# Patient Record
Sex: Female | Born: 1960 | Race: White | Hispanic: No | Marital: Married | Smoking: Never smoker
Health system: Southern US, Community
[De-identification: ages and names within clinical notes are randomized; demographics above are authoritative.]

## PROBLEM LIST (undated history)

## (undated) HISTORY — PX: KNEE SURGERY: SHX244

---

## 2014-07-29 ENCOUNTER — Ambulatory Visit: Payer: Self-pay | Admitting: Family Medicine

## 2014-07-29 LAB — URINALYSIS, COMPLETE
Glucose,UR: NEGATIVE
LEUKOCYTE ESTERASE: NEGATIVE
Nitrite: NEGATIVE
Ph: 5.5 (ref 5.0–8.0)

## 2014-07-31 LAB — URINE CULTURE

## 2015-05-03 ENCOUNTER — Ambulatory Visit
Admission: EM | Admit: 2015-05-03 | Discharge: 2015-05-03 | Disposition: A | Discharge status: Home / Self Care | Payer: Managed Care, Other (non HMO) | Attending: Family Medicine | Admitting: Family Medicine

## 2015-05-03 ENCOUNTER — Encounter: Payer: Self-pay | Admitting: Emergency Medicine

## 2015-05-03 DIAGNOSIS — F1099 Alcohol use, unspecified with unspecified alcohol-induced disorder: Secondary | ICD-10-CM | POA: Insufficient documentation

## 2015-05-03 DIAGNOSIS — R350 Frequency of micturition: Secondary | ICD-10-CM | POA: Insufficient documentation

## 2015-05-03 DIAGNOSIS — N951 Menopausal and female climacteric states: Secondary | ICD-10-CM

## 2015-05-03 DIAGNOSIS — Z780 Asymptomatic menopausal state: Secondary | ICD-10-CM | POA: Diagnosis not present

## 2015-05-03 DIAGNOSIS — B9620 Unspecified Escherichia coli [E. coli] as the cause of diseases classified elsewhere: Secondary | ICD-10-CM | POA: Insufficient documentation

## 2015-05-03 DIAGNOSIS — N390 Urinary tract infection, site not specified: Secondary | ICD-10-CM | POA: Diagnosis not present

## 2015-05-03 DIAGNOSIS — R109 Unspecified abdominal pain: Secondary | ICD-10-CM | POA: Diagnosis present

## 2015-05-03 LAB — URINALYSIS COMPLETE WITH MICROSCOPIC (ARMC ONLY)
Bilirubin Urine: NEGATIVE
Glucose, UA: NEGATIVE mg/dL
Ketones, ur: NEGATIVE mg/dL
Nitrite: NEGATIVE
Protein, ur: 100 mg/dL — AB
pH: 5.5 (ref 5.0–8.0)

## 2015-05-03 MED ORDER — CIPROFLOXACIN HCL 500 MG PO TABS: 500.0000 mg | ORAL_TABLET | Freq: Two times a day (BID) | ORAL | Status: AC

## 2015-05-03 MED ORDER — PHENAZOPYRIDINE HCL 200 MG PO TABS: 200.0000 mg | ORAL_TABLET | Freq: Three times a day (TID) | ORAL | Status: AC | PRN

## 2015-05-03 NOTE — ED Provider Notes (Addendum)
CSN: [id number]     Arrival date & time 05/03/15  0825 History   First MD Initiated Contact with Patient 05/03/15 [phone]     Chief Complaint  Patient presents with  . Urinary Tract Infection  . Urinary Frequency   (Consider location/radiation/quality/duration/timing/severity/associated sxs/prior Treatment) HPI Comments: Married caucasian female here for urinary frequency x 1 week; flank pain x 1 day; noticed increased frequency UTI since starting perimenopause this past year; worst symptoms yesterday pain with walking and low back pain also;  Father with kidney stones unsure of type has required surgery she has tried cutting out caffeine and changing soaps since these started without any improvement; doesn't like to drink water and follows vegan diet cheese only occasionally  Patient is a 54 y.o. female presenting with urinary tract infection and frequency. The history is provided by the patient.  Urinary Tract Infection Pain quality:  Burning Pain severity:  Moderate Onset quality:  Sudden Duration:  1 week Timing:  Constant Progression:  Worsening Chronicity:  Recurrent Recent urinary tract infections: yes   Relieved by:  Nothing Worsened by:  Caffeine Urinary symptoms: frequent urination   Urinary symptoms: no discolored urine, no foul-smelling urine, no hematuria, no hesitancy and no bladder incontinence   Associated symptoms: flank pain   Associated symptoms: no abdominal pain, no fever, no genital lesions, no nausea, no vaginal discharge and no vomiting   Risk factors: recurrent urinary tract infections and sexually active   Risk factors: no hx of pyelonephritis, no hx of urolithiasis, no kidney transplant, not pregnant, no renal cysts, no renal disease, not single kidney, no sexually transmitted infections and no urinary catheter   Urinary Frequency Pertinent negatives include no chest pain, no abdominal pain, no headaches and no shortness of breath.    History reviewed. No  pertinent past medical history. Past Surgical History  Procedure Laterality Date  . Knee surgery     History reviewed. No pertinent family history. Social History  Substance Use Topics  . Smoking status: Never Smoker   . Smokeless tobacco: None  . Alcohol Use: Yes     Comment: once weekly   OB History    No data available     Review of Systems  Constitutional: Negative for fever, chills, diaphoresis, activity change, appetite change and fatigue.  HENT: Negative for congestion, dental problem, drooling, ear discharge, ear pain, facial swelling, hearing loss, mouth sores, nosebleeds, postnasal drip, rhinorrhea, sinus pressure, sneezing, sore throat, tinnitus, trouble swallowing and voice change.   Eyes: Negative for photophobia, pain, discharge, redness, itching and visual disturbance.  Respiratory: Negative for cough, choking, chest tightness, shortness of breath, wheezing and stridor.   Cardiovascular: Negative for chest pain, palpitations and leg swelling.  Gastrointestinal: Negative for nausea, vomiting, abdominal pain, diarrhea, constipation, blood in stool and abdominal distention.  Endocrine: Negative for cold intolerance and heat intolerance.  Genitourinary: Positive for frequency and flank pain. Negative for vaginal discharge.  Musculoskeletal: Positive for back pain. Negative for joint swelling, arthralgias, gait problem, neck pain and neck stiffness.  Skin: Negative for color change, pallor, rash and wound.  Allergic/Immunologic: Negative for environmental allergies and food allergies.  Neurological: Negative for dizziness, tremors, seizures, syncope, facial asymmetry, speech difficulty, weakness, light-headedness, numbness and headaches.  Hematological: Negative for adenopathy. Does not bruise/bleed easily.  Psychiatric/Behavioral: Negative for behavioral problems, confusion, sleep disturbance and agitation.    Allergies  Review of patient's allergies indicates no known  allergies.  Home Medications   Prior to Admission medications  Medication Sig Start Date End Date Taking? Authorizing Provider  ciprofloxacin (CIPRO) 500 MG tablet Take 1 tablet (500 mg total) by mouth 2 (two) times daily. 05/03/15   Barbaraann Barthel, NP  phenazopyridine (PYRIDIUM) 200 MG tablet Take 1 tablet (200 mg total) by mouth 3 (three) times daily as needed for pain. 05/03/15   Jarold Song Betancourt, NP   BP 130/65 mmHg  Pulse 59  Temp(Src) 98 F (36.7 C) (Oral)  Resp 16  SpO2 98% Physical Exam  Constitutional: She is oriented to person, place, and time. Vital signs are normal. She appears well-developed and well-nourished. No distress.  HENT:  Head: Normocephalic and atraumatic.  Right Ear: External ear normal.  Left Ear: External ear normal.  Nose: Nose normal.  Mouth/Throat: Oropharynx is clear and moist. No oropharyngeal exudate.  Eyes: Conjunctivae, EOM and lids are normal. Pupils are equal, round, and reactive to light. Right eye exhibits no discharge. Left eye exhibits no discharge.  Neck: Trachea normal and normal range of motion. Neck supple. No tracheal deviation present.  Cardiovascular: Normal rate, regular rhythm, S1 normal, S2 normal, normal heart sounds and intact distal pulses.  PMI is not displaced.  Exam reveals no gallop and no friction rub.   No murmur heard. Pulmonary/Chest: Effort normal and breath sounds normal. No accessory muscle usage or stridor. No respiratory distress. She has no decreased breath sounds. She has no wheezes. She has no rhonchi. She has no rales. She exhibits no tenderness.  Abdominal: Soft. Bowel sounds are normal. She exhibits no shifting dullness, no distension, no pulsatile liver, no fluid wave, no abdominal bruit, no ascites, no pulsatile midline mass and no mass. There is no hepatosplenomegaly. There is no tenderness. There is no rigidity, no rebound, no guarding, no CVA tenderness, no tenderness at McBurney's point and negative Murphy's  sign. Hernia confirmed negative in the ventral area.  Dull to percussion x 4 quads  Musculoskeletal: Normal range of motion. She exhibits no edema or tenderness.  Lymphadenopathy:    She has no cervical adenopathy.  Neurological: She is alert and oriented to person, place, and time. She exhibits normal muscle tone. Coordination normal.  Skin: Skin is warm, dry and intact. No rash noted. She is not diaphoretic. No erythema. No pallor.  Psychiatric: She has a normal mood and affect. Her speech is normal and behavior is normal. Judgment and thought content normal. Cognition and memory are normal.  Nursing note and vitals reviewed.   ED Course  Procedures (including critical care time) Labs Review Labs Reviewed  URINALYSIS COMPLETEWITH MICROSCOPIC ([facility] ONLY) - Abnormal; Notable for the following:    APPearance CLOUDY (*)    Specific Gravity, Urine >1.030 (*)    Hgb urine dipstick 3+ (*)    Protein, ur 100 (*)    Leukocytes, UA 3+ (*)    Bacteria, UA FEW (*)    Squamous Epithelial / LPF 6-30 (*)    All other components within normal limits  URINE CULTURE   Reviewed previous results Oct 2015 + protein, bacteria negative culture Imaging Review No results found.  7829 patient to bathroom to urinate ambulatory without difficulty  1110 discussed urinalysis results with patient and given copy; very dilute urine infection worse than previous.  Discussed black box warning on cipro; patient prefers to take it as cleared up her last infection.  Urine did not grow out bacteria last time so unable to tailor if any resistance at this time.  Hydrate, hydrate, hydrate.  Follow  up with father to find out what type of stones he has.  Follow up with PCM if no improvement in symptoms/reoccurence consider urology referral/imaging. Discussed drinking at least 2 liters fluid per day during summer as active outside.   Patient verbalized understanding of information/instructions, agreed with plan of care and  had no further questions at this time. MDM   1. Perimenopause   2. UTI (lower urinary tract infection)    Plan: 1. Test/x-ray results and diagnosis reviewed with patient 2. rx as per orders; risks, benefits, potential side effects reviewed with patient 3. Recommend supportive treatment with hydration, pyridium 4. F/u prn if symptoms worsen or don't improve  Medications as directed.  Patient is also to push fluids and may use Pyridium 200mg  po TID as needed Rx given. Call or return to clinic as needed if these symptoms worsen or fail to improve as anticipated e.g. Unable to void every 8 hours, hematuria gross, changes in color of urine after pyridium use completed e.g. Tea colored/cloudy/foul odor, headache, worsening back pain.  Exitcare handout on cystitis, perimenopause, kidney stones, urethritis given to patient  Discussed showering after sex, lubricant/trauma may cause urethritis/cystitis, especially with decreased estrogen at risk UTI/urethritis.  Discussed hydration status, previous urinalysis results probable negative urine culture as unable to void and po hydration x 3 cups water before able to urinate in clinic.  Will call with urine culture results once available typically 48 hours. exitcare handouts on perimenopause, urethritis, UTI, kidney stones, dehydration given to patient.  Patient verbalized agreement and understanding of treatment plan and had no further questions at this time. P2:  Hydrate and cranberry juice   Barbaraann Barthel, NP 05/03/15 1115  06 May 2015 at 0800 left message for patient to contact clinic.  E.coli on urine culture sensitive to cipro.  If patient still symptomatic will call in another antibiotic for her.  Barbaraann Barthel, NP 05/06/15 0800

## 2015-05-03 NOTE — Discharge Instructions (Signed)
Perimenopause Perimenopause is the time when your body begins to move into the menopause (no menstrual period for 12 straight months). It is a natural process. Perimenopause can begin 2-8 years before the menopause and usually lasts for 1 year after the menopause. During this time, your ovaries may or may not produce an egg. The ovaries vary in their production of estrogen and progesterone hormones each month. This can cause irregular menstrual periods, difficulty getting pregnant, vaginal bleeding between periods, and uncomfortable symptoms. CAUSES  Irregular production of the ovarian hormones, estrogen and progesterone, and not ovulating every month.  Other causes include:  Tumor of the pituitary gland in the brain.  Medical disease that affects the ovaries.  Radiation treatment.  Chemotherapy.  Unknown causes.  Heavy smoking and excessive alcohol intake can bring on perimenopause sooner. SIGNS AND SYMPTOMS   Hot flashes.  Night sweats.  Irregular menstrual periods.  Decreased sex drive.  Vaginal dryness.  Headaches.  Mood swings.  Depression.  Memory problems.  Irritability.  Tiredness.  Weight gain.  Trouble getting pregnant.  The beginning of losing bone cells (osteoporosis).  The beginning of hardening of the arteries (atherosclerosis). DIAGNOSIS  Your health care provider will make a diagnosis by analyzing your age, menstrual history, and symptoms. He or she will do a physical exam and note any changes in your body, especially your female organs. Female hormone tests may or may not be helpful depending on the amount of female hormones you produce and when you produce them. However, other hormone tests may be helpful to rule out other problems. TREATMENT  In some cases, no treatment is needed. The decision on whether treatment is necessary during the perimenopause should be made by you and your health care provider based on how the symptoms are affecting you  and your lifestyle. Various treatments are available, such as:  Treating individual symptoms with a specific medicine for that symptom.  Herbal medicines that can help specific symptoms.  Counseling.  Group therapy. HOME CARE INSTRUCTIONS   Keep track of your menstrual periods (when they occur, how heavy they are, how long between periods, and how long they last) as well as your symptoms and when they started.  Only take over-the-counter or prescription medicines as directed by your health care provider.  Sleep and rest.  Exercise.  Eat a diet that contains calcium (good for your bones) and soy (acts like the estrogen hormone).  Do not smoke.  Avoid alcoholic beverages.  Take vitamin supplements as recommended by your health care provider. Taking vitamin E may help in certain cases.  Take calcium and vitamin D supplements to help prevent bone loss.  Group therapy is sometimes helpful.  Acupuncture may help in some cases. SEEK MEDICAL CARE IF:   You have questions about any symptoms you are having.  You need a referral to a specialist (gynecologist, psychiatrist, or psychologist). SEEK IMMEDIATE MEDICAL CARE IF:   You have vaginal bleeding.  Your period lasts longer than 8 days.  Your periods are recurring sooner than 21 days.  You have bleeding after intercourse.  You have severe depression.  You have pain when you urinate.  You have severe headaches.  You have vision problems. Document Released: 10/08/2004 Document Revised: 06/21/2013 Document Reviewed: 03/30/2013 [facility] Patient Information 2015 [place], [place]. This information is not intended to replace advice given to you by your health care provider. Make sure you discuss any questions you have with your health care provider. Urethritis Urethritis is an inflammation  of the tube through which urine exits your bladder (urethra).  CAUSES Urethritis is often caused by an infection in your urethra. The  infection can be viral, like herpes. The infection can also be bacterial, like gonorrhea. RISK FACTORS Risk factors of urethritis include:  Having sex without using a condom.  Having multiple sexual partners.  Having poor hygiene. SIGNS AND SYMPTOMS Symptoms of urethritis are less noticeable in women than in men. These symptoms include:  Burning feeling when you urinate (dysuria).  Discharge from your urethra.  Blood in your urine (hematuria).  Urinating more than usual. DIAGNOSIS  To confirm a diagnosis of urethritis, your health care provider will do the following:  Ask about your sexual history.  Perform a physical exam.  Have you provide a sample of your urine for lab testing.  Use a cotton swab to gently collect a sample from your urethra for lab testing. TREATMENT  It is important to treat urethritis. Depending on the cause, untreated urethritis may lead to serious genital infections and possibly infertility. Urethritis caused by a bacterial infection is treated with antibiotic medicine. All sexual partners must be treated.  HOME CARE INSTRUCTIONS  Do not have sex until the test results are known and treatment is completed, even if your symptoms go away before you finish treatment.  If you were prescribed an antibiotic, finish it all even if you start to feel better. SEEK MEDICAL CARE IF:   Your symptoms are not improved in 3 days.  Your symptoms are getting worse.  You develop abdominal pain or pelvic pain (in women).  You develop joint pain.  You have a fever. SEEK IMMEDIATE MEDICAL CARE IF:   You have severe pain in the belly, back, or side.  You have repeated vomiting. MAKE SURE YOU:  Understand these instructions.  Will watch your condition.  Will get help right away if you are not doing well or get worse. Document Released: 02/24/2001 Document Revised: 01/15/2014 Document Reviewed: 05/01/2013 [facility] Patient Information 2015 [place], [place].  This information is not intended to replace advice given to you by your health care provider. Make sure you discuss any questions you have with your health care provider. Kidney Stones Kidney stones (urolithiasis) are deposits that form inside your kidneys. The intense pain is caused by the stone moving through the urinary tract. When the stone moves, the ureter goes into spasm around the stone. The stone is usually passed in the urine.  CAUSES   A disorder that makes certain neck glands produce too much parathyroid hormone (primary hyperparathyroidism).  A buildup of uric acid crystals, similar to gout in your joints.  Narrowing (stricture) of the ureter.  A kidney obstruction present at birth (congenital obstruction).  Previous surgery on the kidney or ureters.  Numerous kidney infections. SYMPTOMS   Feeling sick to your stomach (nauseous).  Throwing up (vomiting).  Blood in the urine (hematuria).  Pain that usually spreads (radiates) to the groin.  Frequency or urgency of urination. DIAGNOSIS   Taking a history and physical exam.  Blood or urine tests.  CT scan.  Occasionally, an examination of the inside of the urinary bladder (cystoscopy) is performed. TREATMENT   Observation.  Increasing your fluid intake.  Extracorporeal shock wave lithotripsy--This is a noninvasive procedure that uses shock waves to break up kidney stones.  Surgery may be needed if you have severe pain or persistent obstruction. There are various surgical procedures. Most of the procedures are performed with the use of small  instruments. Only small incisions are needed to accommodate these instruments, so recovery time is minimized. The size, location, and chemical composition are all important variables that will determine the proper choice of action for you. Talk to your health care provider to better understand your situation so that you will minimize the risk of injury to yourself and your  kidney.  HOME CARE INSTRUCTIONS   Drink enough water and fluids to keep your urine clear or pale yellow. This will help you to pass the stone or stone fragments.  Strain all urine through the provided strainer. Keep all particulate matter and stones for your health care provider to see. The stone causing the pain may be as small as a grain of salt. It is very important to use the strainer each and every time you pass your urine. The collection of your stone will allow your health care provider to analyze it and verify that a stone has actually passed. The stone analysis will often identify what you can do to reduce the incidence of recurrences.  Only take over-the-counter or prescription medicines for pain, discomfort, or fever as directed by your health care provider.  Make a follow-up appointment with your health care provider as directed.  Get follow-up X-rays if required. The absence of pain does not always mean that the stone has passed. It may have only stopped moving. If the urine remains completely obstructed, it can cause loss of kidney function or even complete destruction of the kidney. It is your responsibility to make sure X-rays and follow-ups are completed. Ultrasounds of the kidney can show blockages and the status of the kidney. Ultrasounds are not associated with any radiation and can be performed easily in a matter of minutes. SEEK MEDICAL CARE IF:  You experience pain that is progressive and unresponsive to any pain medicine you have been prescribed. SEEK IMMEDIATE MEDICAL CARE IF:   Pain cannot be controlled with the prescribed medicine.  You have a fever or shaking chills.  The severity or intensity of pain increases over 18 hours and is not relieved by pain medicine.  You develop a new onset of abdominal pain.  You feel faint or pass out.  You are unable to urinate. MAKE SURE YOU:   Understand these instructions.  Will watch your condition.  Will get help  right away if you are not doing well or get worse. Document Released: 08/31/2005 Document Revised: 05/03/2013 Document Reviewed: 02/01/2013 [facility] Patient Information 2015 [place], [place]. This information is not intended to replace advice given to you by your health care provider. Make sure you discuss any questions you have with your health care provider. Dietary Guidelines to Help Prevent Kidney Stones Your risk of kidney stones can be decreased by adjusting the foods you eat. The most important thing you can do is drink enough fluid. You should drink enough fluid to keep your urine clear or pale yellow. The following guidelines provide specific information for the type of kidney stone you have had. GUIDELINES ACCORDING TO TYPE OF KIDNEY STONE Calcium Oxalate Kidney Stones  Reduce the amount of salt you eat. Foods that have a lot of salt cause your body to release excess calcium into your urine. The excess calcium can combine with a substance called oxalate to form kidney stones.  Reduce the amount of animal protein you eat if the amount you eat is excessive. Animal protein causes your body to release excess calcium into your urine. Ask your dietitian how much protein from  animal sources you should be eating.  Avoid foods that are high in oxalates. If you take vitamins, they should have less than 500 mg of vitamin C. Your body turns vitamin C into oxalates. You do not need to avoid fruits and vegetables high in vitamin C. Calcium Phosphate Kidney Stones  Reduce the amount of salt you eat to help prevent the release of excess calcium into your urine.  Reduce the amount of animal protein you eat if the amount you eat is excessive. Animal protein causes your body to release excess calcium into your urine. Ask your dietitian how much protein from animal sources you should be eating.  Get enough calcium from food or take a calcium supplement (ask your dietitian for recommendations). Food sources  of calcium that do not increase your risk of kidney stones include:  Broccoli.  Dairy products, such as cheese and yogurt.  Pudding. Uric Acid Kidney Stones  Do not have more than 6 oz of animal protein per day. FOOD SOURCES Animal Protein Sources  Meat (all types).  Poultry.  Eggs.  Fish, seafood. Foods High in [employer] seasonings.  Soy sauce.  Teriyaki sauce.  Cured and processed meats.  Salted crackers and snack foods.  Fast food.  Canned soups and most canned foods. Foods High in Oxalates  Grains:  Amaranth.  Barley.  Grits.  Wheat germ.  Bran.  Buckwheat flour.  All bran cereals.  Pretzels.  Whole wheat bread.  Vegetables:  Beans (wax).  Beets and beet greens.  Collard greens.  Eggplant.  [place].  Leeks.  Okra.  Parsley.  Rutabagas.  Spinach.  Swiss chard.  Tomato paste.  Fried potatoes.  Sweet potatoes.  Fruits:  Red currants.  Figs.  Kiwi.  Rhubarb.  Meat and Other Protein Sources:  Beans (dried).  Soy burgers and other soybean products.  Miso.  Nuts (peanuts, almonds, pecans, cashews, hazelnuts).  Nut butters.  Sesame seeds and tahini (paste made of sesame seeds).  Poppy seeds.  Beverages:  Chocolate drink mixes.  Soy milk.  Instant iced tea.  Juices made from high-oxalate fruits or vegetables.  Other:  Carob.  Chocolate.  Fruitcake.  Marmalades. Document Released: 12/26/2010 Document Revised: 09/05/2013 Document Reviewed: 07/28/2013 [facility] Patient Information 2015 [place], [place]. This information is not intended to replace advice given to you by your health care provider. Make sure you discuss any questions you have with your health care provider. Urinary Tract Infection Urinary tract infections (UTIs) can develop anywhere along your urinary tract. Your urinary tract is your body's drainage system for removing wastes and extra water. Your urinary tract includes two  kidneys, two ureters, a bladder, and a urethra. Your kidneys are a pair of bean-shaped organs. Each kidney is about the size of your fist. They are located below your ribs, one on each side of your spine. CAUSES Infections are caused by microbes, which are microscopic organisms, including fungi, viruses, and bacteria. These organisms are so small that they can only be seen through a microscope. Bacteria are the microbes that most commonly cause UTIs. SYMPTOMS  Symptoms of UTIs may vary by age and gender of the patient and by the location of the infection. Symptoms in young women typically include a frequent and intense urge to urinate and a painful, burning feeling in the bladder or urethra during urination. Older women and men are more likely to be tired, shaky, and weak and have muscle aches and abdominal pain. A fever may mean the infection  is in your kidneys. Other symptoms of a kidney infection include pain in your back or sides below the ribs, nausea, and vomiting. DIAGNOSIS To diagnose a UTI, your caregiver will ask you about your symptoms. Your caregiver also will ask to provide a urine sample. The urine sample will be tested for bacteria and white blood cells. White blood cells are made by your body to help fight infection. TREATMENT  Typically, UTIs can be treated with medication. Because most UTIs are caused by a bacterial infection, they usually can be treated with the use of antibiotics. The choice of antibiotic and length of treatment depend on your symptoms and the type of bacteria causing your infection. HOME CARE INSTRUCTIONS  If you were prescribed antibiotics, take them exactly as your caregiver instructs you. Finish the medication even if you feel better after you have only taken some of the medication.  Drink enough water and fluids to keep your urine clear or pale yellow.  Avoid caffeine, tea, and carbonated beverages. They tend to irritate your bladder.  Empty your bladder  often. Avoid holding urine for long periods of time.  Empty your bladder before and after sexual intercourse.  After a bowel movement, women should cleanse from front to back. Use each tissue only once. SEEK MEDICAL CARE IF:   You have back pain.  You develop a fever.  Your symptoms do not begin to resolve within 3 days. SEEK IMMEDIATE MEDICAL CARE IF:   You have severe back pain or lower abdominal pain.  You develop chills.  You have nausea or vomiting.  You have continued burning or discomfort with urination. MAKE SURE YOU:   Understand these instructions.  Will watch your condition.  Will get help right away if you are not doing well or get worse. Document Released: 06/10/2005 Document Revised: 03/01/2012 Document Reviewed: 10/09/2011 [facility] Patient Information 2015 [place], [place]. This information is not intended to replace advice given to you by your health care provider. Make sure you discuss any questions you have with your health care provider.

## 2015-05-03 NOTE — ED Notes (Signed)
Pt states tht she has had urinary frequency for the past 4 days with urinary pressure

## 2015-05-05 LAB — URINE CULTURE
Culture: >=100000
SPECIAL REQUESTS: NORMAL

## 2017-07-19 ENCOUNTER — Other Ambulatory Visit: Payer: Self-pay | Admitting: Family Medicine

## 2017-07-19 DIAGNOSIS — Z1231 Encounter for screening mammogram for malignant neoplasm of breast: Secondary | ICD-10-CM

## 2017-07-21 ENCOUNTER — Ambulatory Visit
Admission: RE | Admit: 2017-07-21 | Discharge: 2017-07-21 | Disposition: A | Discharge status: Home / Self Care | Payer: Managed Care, Other (non HMO) | Source: Ambulatory Visit | Attending: Family Medicine | Admitting: Family Medicine

## 2017-07-21 DIAGNOSIS — Z1231 Encounter for screening mammogram for malignant neoplasm of breast: Secondary | ICD-10-CM | POA: Insufficient documentation

## 2018-06-14 ENCOUNTER — Other Ambulatory Visit: Payer: Self-pay | Admitting: Family Medicine

## 2018-06-14 DIAGNOSIS — R102 Pelvic and perineal pain: Secondary | ICD-10-CM

## 2018-06-15 ENCOUNTER — Other Ambulatory Visit: Payer: Self-pay | Admitting: Family Medicine

## 2018-06-15 DIAGNOSIS — Z1231 Encounter for screening mammogram for malignant neoplasm of breast: Secondary | ICD-10-CM

## 2018-06-20 ENCOUNTER — Ambulatory Visit
Admission: RE | Admit: 2018-06-20 | Discharge: 2018-06-20 | Disposition: A | Discharge status: Home / Self Care | Payer: 59 | Source: Ambulatory Visit | Attending: Family Medicine | Admitting: Family Medicine

## 2018-06-20 DIAGNOSIS — R102 Pelvic and perineal pain: Secondary | ICD-10-CM | POA: Insufficient documentation

## 2018-07-25 ENCOUNTER — Encounter (INDEPENDENT_AMBULATORY_CARE_PROVIDER_SITE_OTHER): Payer: Self-pay

## 2018-07-25 ENCOUNTER — Ambulatory Visit
Admission: RE | Admit: 2018-07-25 | Discharge: 2018-07-25 | Disposition: A | Discharge status: Home / Self Care | Payer: 59 | Source: Ambulatory Visit | Attending: Family Medicine | Admitting: Family Medicine

## 2018-07-25 DIAGNOSIS — Z1231 Encounter for screening mammogram for malignant neoplasm of breast: Secondary | ICD-10-CM | POA: Diagnosis present

## 2019-07-24 ENCOUNTER — Other Ambulatory Visit: Payer: Self-pay | Admitting: Family Medicine

## 2019-07-24 DIAGNOSIS — Z1231 Encounter for screening mammogram for malignant neoplasm of breast: Secondary | ICD-10-CM

## 2019-08-02 ENCOUNTER — Other Ambulatory Visit: Payer: Self-pay

## 2019-08-02 ENCOUNTER — Ambulatory Visit
Admission: RE | Admit: 2019-08-02 | Discharge: 2019-08-02 | Disposition: A | Discharge status: Home / Self Care | Payer: 59 | Source: Ambulatory Visit | Attending: Family Medicine | Admitting: Family Medicine

## 2019-08-02 DIAGNOSIS — Z1231 Encounter for screening mammogram for malignant neoplasm of breast: Secondary | ICD-10-CM | POA: Diagnosis not present

## 2020-04-02 IMAGING — MG DIGITAL SCREENING BILAT W/ TOMO W/ CAD
6 of 12 series · 6 of 36 positions shown · non-contrast
Comparison: Previous exam(s).

CLINICAL DATA: Screening.

EXAM:
DIGITAL SCREENING BILATERAL MAMMOGRAM WITH TOMO AND CAD

[L MLO synth-2D (1 of 2)]
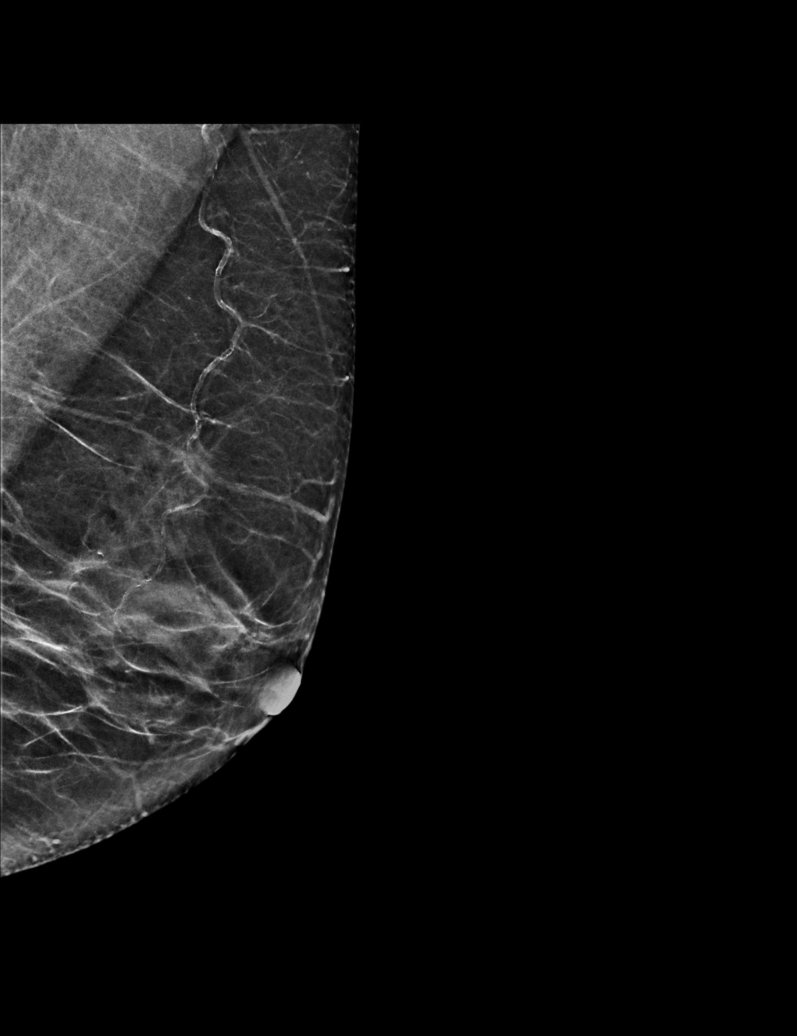

[R CC synth-2D (1 of 2)]
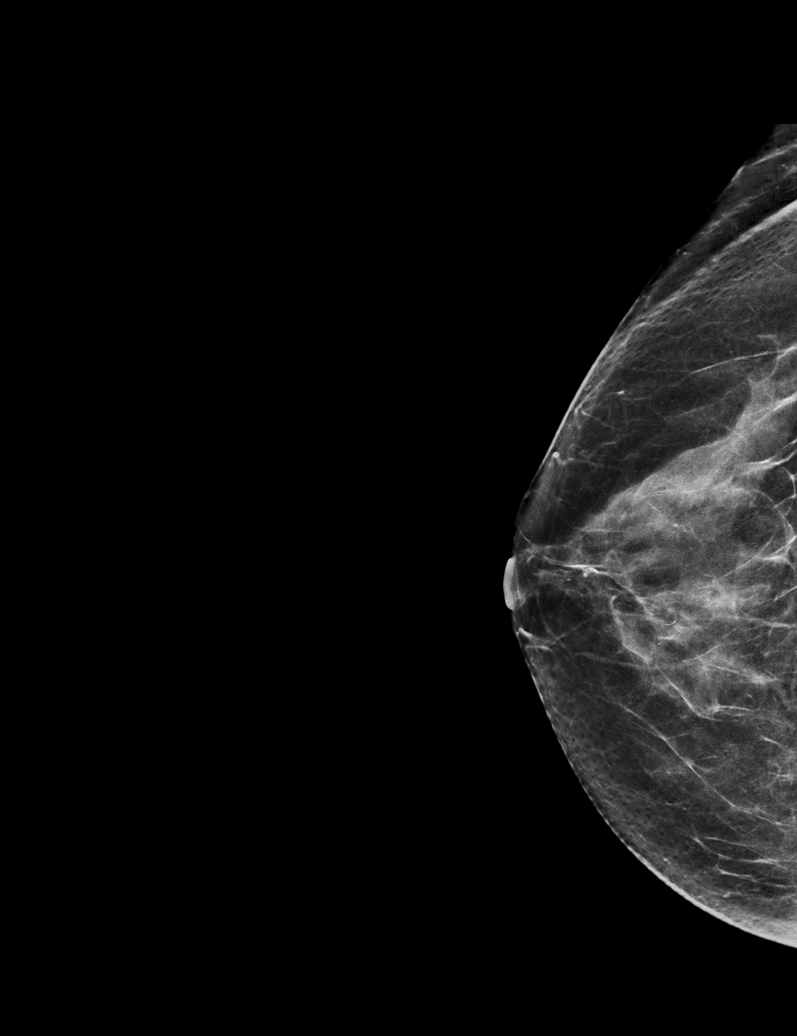

[R CC synth-2D (2 of 2)]
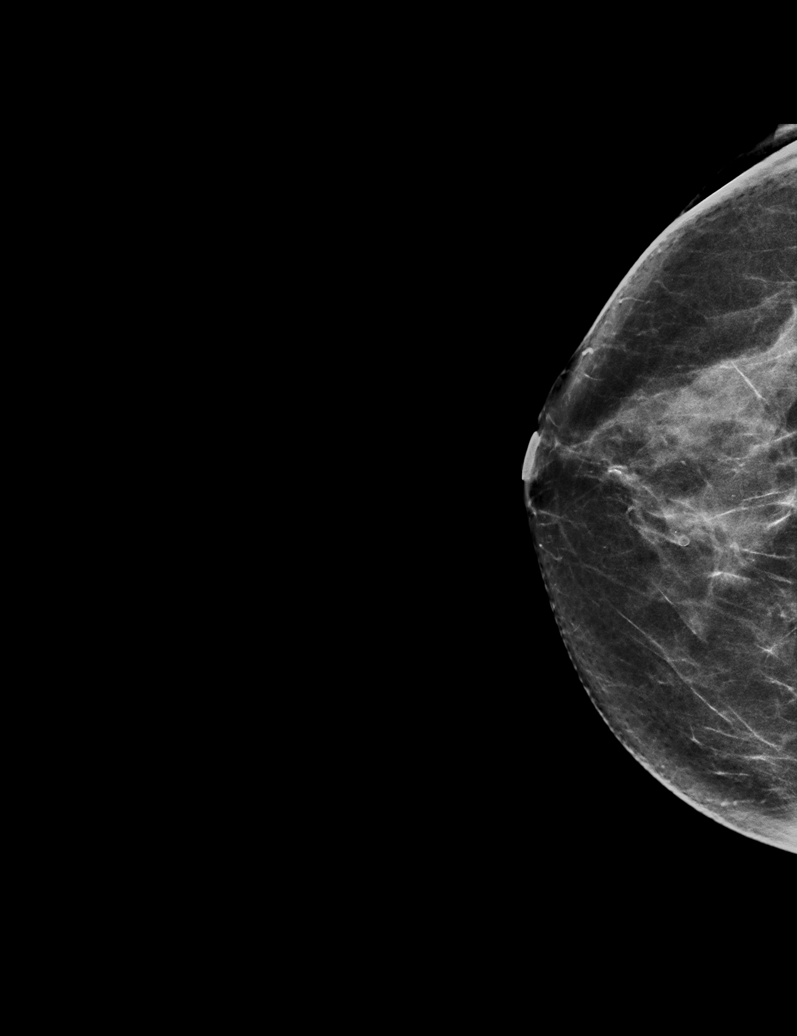

[L CC synth-2D]
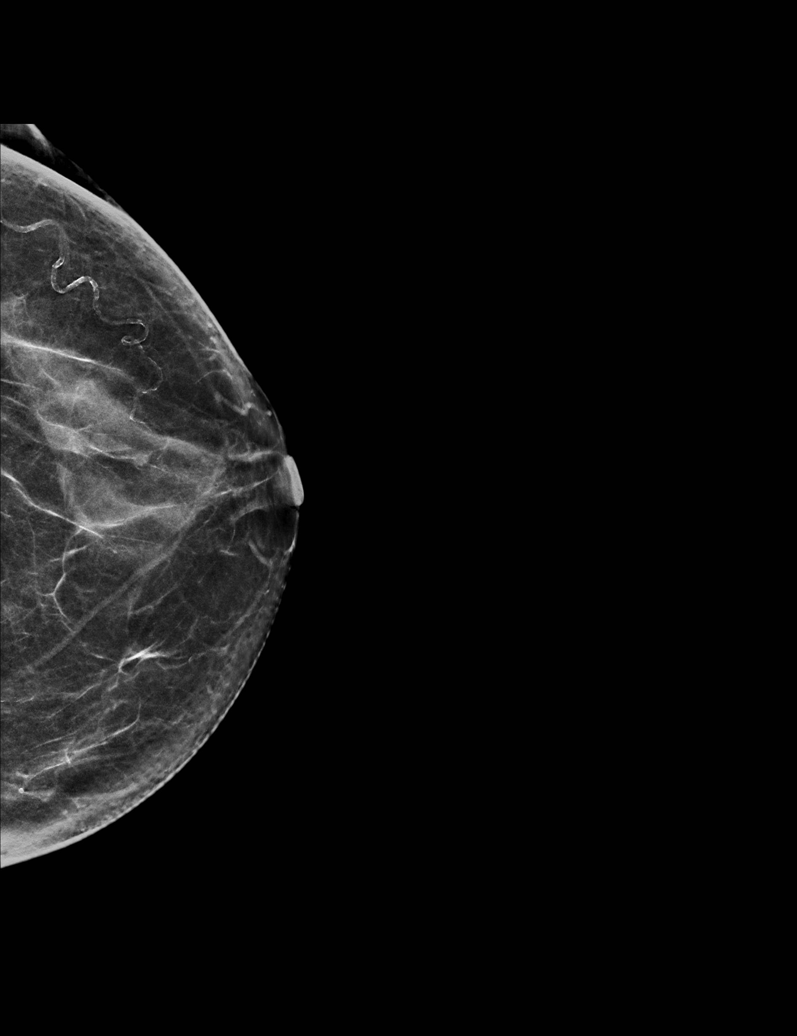

[L MLO synth-2D (2 of 2)]
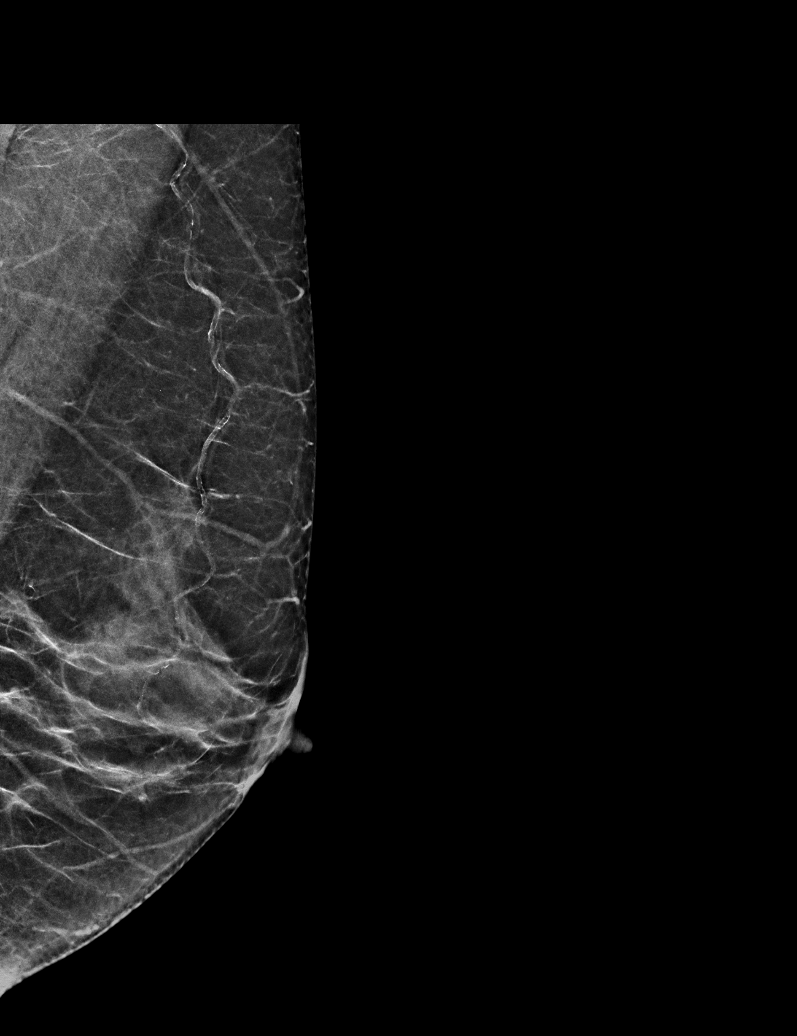

[R MLO synth-2D]
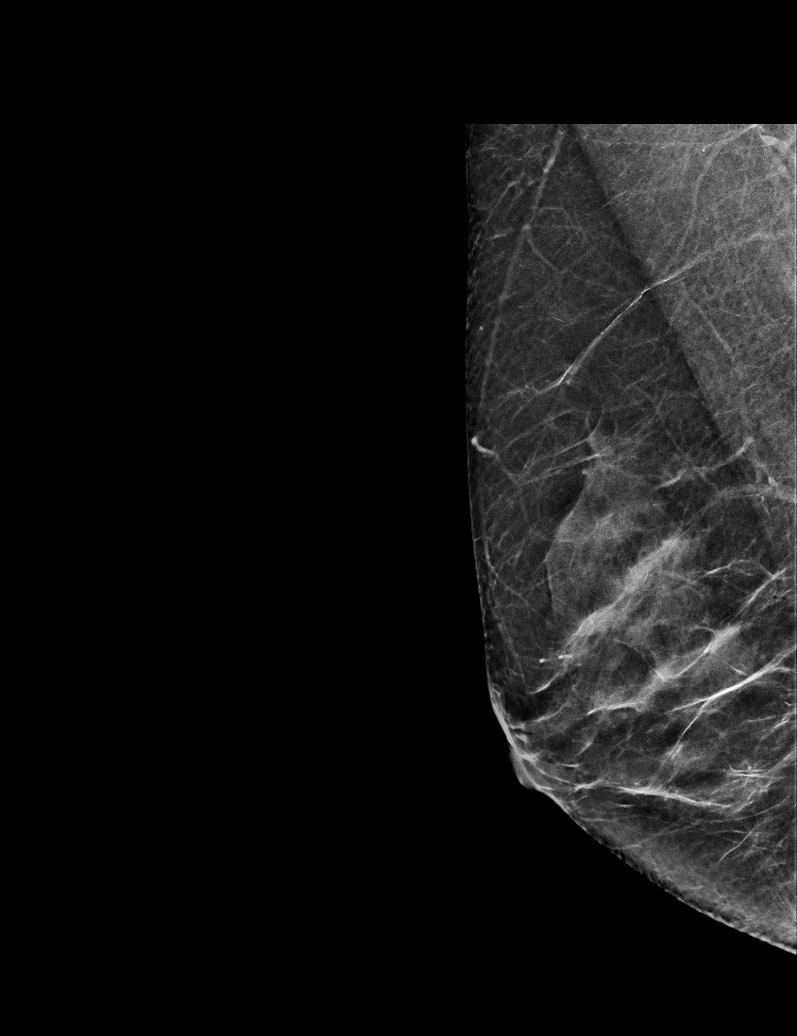

[6 of 36 positions shown; findings below may reference images not displayed]

ACR Breast Density Category c: The breast tissue is heterogeneously
dense, which may obscure small masses.
FINDINGS: There are no findings suspicious for malignancy. Images were
processed with CAD.
IMPRESSION: No mammographic evidence of malignancy. A result letter of this
screening mammogram will be mailed directly to the patient.

RECOMMENDATION:
Screening mammogram in one year. (Code:[id number])

BI-RADS CATEGORY  1: Negative.

## 2020-06-28 ENCOUNTER — Other Ambulatory Visit: Payer: Self-pay | Admitting: Family Medicine

## 2020-06-28 DIAGNOSIS — Z1231 Encounter for screening mammogram for malignant neoplasm of breast: Secondary | ICD-10-CM

## 2020-08-05 ENCOUNTER — Ambulatory Visit
Admission: RE | Admit: 2020-08-05 | Discharge: 2020-08-05 | Disposition: A | Discharge status: Home / Self Care | Payer: 59 | Source: Ambulatory Visit | Attending: Family Medicine | Admitting: Family Medicine

## 2020-08-05 ENCOUNTER — Other Ambulatory Visit: Payer: Self-pay

## 2020-08-05 DIAGNOSIS — Z1231 Encounter for screening mammogram for malignant neoplasm of breast: Secondary | ICD-10-CM | POA: Diagnosis not present

## 2021-08-26 ENCOUNTER — Other Ambulatory Visit: Payer: Self-pay | Admitting: Family Medicine

## 2021-08-26 DIAGNOSIS — Z1231 Encounter for screening mammogram for malignant neoplasm of breast: Secondary | ICD-10-CM

## 2021-09-11 ENCOUNTER — Ambulatory Visit: Payer: 59

## 2021-10-09 ENCOUNTER — Other Ambulatory Visit: Payer: Self-pay

## 2021-10-09 ENCOUNTER — Ambulatory Visit
Admission: RE | Admit: 2021-10-09 | Discharge: 2021-10-09 | Disposition: A | Discharge status: Home / Self Care | Payer: 59 | Source: Ambulatory Visit | Attending: Family Medicine | Admitting: Family Medicine

## 2021-10-09 DIAGNOSIS — Z1231 Encounter for screening mammogram for malignant neoplasm of breast: Secondary | ICD-10-CM | POA: Insufficient documentation

## 2022-03-08 ENCOUNTER — Telehealth: Payer: Self-pay | Admitting: Emergency Medicine

## 2022-09-16 ENCOUNTER — Other Ambulatory Visit: Payer: Self-pay | Admitting: Family Medicine

## 2022-09-16 DIAGNOSIS — Z1231 Encounter for screening mammogram for malignant neoplasm of breast: Secondary | ICD-10-CM

## 2022-10-12 ENCOUNTER — Ambulatory Visit
Admission: RE | Admit: 2022-10-12 | Discharge: 2022-10-12 | Disposition: A | Discharge status: Home / Self Care | Payer: 59 | Source: Ambulatory Visit | Attending: Family Medicine | Admitting: Family Medicine

## 2022-10-12 DIAGNOSIS — Z1231 Encounter for screening mammogram for malignant neoplasm of breast: Secondary | ICD-10-CM | POA: Diagnosis not present

## 2022-12-04 ENCOUNTER — Other Ambulatory Visit (HOSPITAL_COMMUNITY): Payer: Self-pay | Admitting: Neurology

## 2022-12-04 DIAGNOSIS — Z7189 Other specified counseling: Secondary | ICD-10-CM

## 2022-12-04 DIAGNOSIS — Z820 Family history of epilepsy and other diseases of the nervous system: Secondary | ICD-10-CM

## 2023-10-06 ENCOUNTER — Other Ambulatory Visit: Payer: Self-pay | Admitting: Family Medicine

## 2023-10-06 DIAGNOSIS — Z1231 Encounter for screening mammogram for malignant neoplasm of breast: Secondary | ICD-10-CM

## 2023-10-14 ENCOUNTER — Ambulatory Visit
Admission: RE | Admit: 2023-10-14 | Discharge: 2023-10-14 | Disposition: A | Discharge status: Home / Self Care | Payer: 59 | Source: Ambulatory Visit | Attending: Family Medicine | Admitting: Family Medicine

## 2023-10-14 DIAGNOSIS — Z1231 Encounter for screening mammogram for malignant neoplasm of breast: Secondary | ICD-10-CM | POA: Insufficient documentation

## 2024-09-18 ENCOUNTER — Other Ambulatory Visit: Payer: Self-pay | Admitting: Family Medicine

## 2024-09-18 DIAGNOSIS — Z1231 Encounter for screening mammogram for malignant neoplasm of breast: Secondary | ICD-10-CM

## 2024-10-16 ENCOUNTER — Ambulatory Visit

## 2024-11-07 ENCOUNTER — Ambulatory Visit: Payer: Self-pay
